# Patient Record
Sex: Female | Born: 1965 | Race: Black or African American | Hispanic: No | Smoking: Never smoker
Health system: Southern US, Community
[De-identification: ages and names within clinical notes are randomized; demographics above are authoritative.]

---

## 2005-04-22 ENCOUNTER — Other Ambulatory Visit: Admission: RE | Admit: 2005-04-22 | Discharge: 2005-04-22 | Payer: Self-pay | Admitting: Obstetrics and Gynecology

## 2005-05-18 ENCOUNTER — Encounter: Admission: RE | Admit: 2005-05-18 | Discharge: 2005-05-18 | Payer: Self-pay | Admitting: Family Medicine

## 2006-05-25 ENCOUNTER — Encounter: Admission: RE | Admit: 2006-05-25 | Discharge: 2006-05-25 | Payer: Self-pay | Admitting: Family Medicine

## 2006-05-31 ENCOUNTER — Encounter: Admission: RE | Admit: 2006-05-31 | Discharge: 2006-05-31 | Payer: Self-pay | Admitting: Family Medicine

## 2007-06-12 ENCOUNTER — Encounter: Admission: RE | Admit: 2007-06-12 | Discharge: 2007-06-12 | Payer: Self-pay | Admitting: Family Medicine

## 2007-06-26 ENCOUNTER — Other Ambulatory Visit: Admission: RE | Admit: 2007-06-26 | Discharge: 2007-06-26 | Payer: Self-pay | Admitting: Obstetrics and Gynecology

## 2008-06-14 ENCOUNTER — Encounter: Admission: RE | Admit: 2008-06-14 | Discharge: 2008-06-14 | Payer: Self-pay | Admitting: Emergency Medicine

## 2009-06-18 ENCOUNTER — Encounter: Admission: RE | Admit: 2009-06-18 | Discharge: 2009-06-18 | Payer: Self-pay | Admitting: Family Medicine

## 2010-03-01 ENCOUNTER — Encounter: Payer: Self-pay | Admitting: Family Medicine

## 2010-05-18 ENCOUNTER — Other Ambulatory Visit: Payer: Self-pay | Admitting: Family Medicine

## 2010-05-18 DIAGNOSIS — Z1231 Encounter for screening mammogram for malignant neoplasm of breast: Secondary | ICD-10-CM

## 2010-06-22 ENCOUNTER — Ambulatory Visit
Admission: RE | Admit: 2010-06-22 | Discharge: 2010-06-22 | Disposition: A | Discharge status: Home / Self Care | Payer: BC Managed Care – PPO | Source: Ambulatory Visit | Attending: Family Medicine | Admitting: Family Medicine

## 2010-06-22 DIAGNOSIS — Z1231 Encounter for screening mammogram for malignant neoplasm of breast: Secondary | ICD-10-CM

## 2011-05-18 ENCOUNTER — Other Ambulatory Visit: Payer: Self-pay | Admitting: Family Medicine

## 2011-05-18 DIAGNOSIS — Z1231 Encounter for screening mammogram for malignant neoplasm of breast: Secondary | ICD-10-CM

## 2011-06-24 ENCOUNTER — Ambulatory Visit
Admission: RE | Admit: 2011-06-24 | Discharge: 2011-06-24 | Disposition: A | Discharge status: Home / Self Care | Payer: BC Managed Care – PPO | Source: Ambulatory Visit | Attending: Family Medicine | Admitting: Family Medicine

## 2011-06-24 DIAGNOSIS — Z1231 Encounter for screening mammogram for malignant neoplasm of breast: Secondary | ICD-10-CM

## 2011-12-17 ENCOUNTER — Encounter: Payer: Self-pay | Admitting: Obstetrics and Gynecology

## 2011-12-17 ENCOUNTER — Ambulatory Visit (INDEPENDENT_AMBULATORY_CARE_PROVIDER_SITE_OTHER): Payer: BC Managed Care – PPO | Admitting: Obstetrics and Gynecology

## 2011-12-17 ENCOUNTER — Ambulatory Visit: Payer: Self-pay | Admitting: Obstetrics and Gynecology

## 2011-12-17 VITALS — BP 146/80 | Resp 18 | Ht 64.0 in | Wt 150.0 lb

## 2011-12-17 DIAGNOSIS — Z113 Encounter for screening for infections with a predominantly sexual mode of transmission: Secondary | ICD-10-CM

## 2011-12-17 DIAGNOSIS — Z01419 Encounter for gynecological examination (general) (routine) without abnormal findings: Secondary | ICD-10-CM

## 2011-12-17 DIAGNOSIS — Z124 Encounter for screening for malignant neoplasm of cervix: Secondary | ICD-10-CM

## 2011-12-17 DIAGNOSIS — N762 Acute vulvitis: Secondary | ICD-10-CM

## 2011-12-17 DIAGNOSIS — N899 Noninflammatory disorder of vagina, unspecified: Secondary | ICD-10-CM

## 2011-12-17 DIAGNOSIS — N898 Other specified noninflammatory disorders of vagina: Secondary | ICD-10-CM

## 2011-12-17 DIAGNOSIS — N760 Acute vaginitis: Secondary | ICD-10-CM

## 2011-12-17 DIAGNOSIS — Z139 Encounter for screening, unspecified: Secondary | ICD-10-CM

## 2011-12-17 MED ORDER — FLUCONAZOLE 150 MG PO TABS: 150.0000 mg | ORAL_TABLET | Freq: Once | ORAL | Status: DC

## 2011-12-17 NOTE — Progress Notes (Signed)
Patient ID: Jennifer Burke, female   DOB: 06-08-1965, 46 y.o.   MRN: [id number] Contraception abstinence/ none Last pap 11/2010 wnl Last Mammo a couple of weeks ago per pt. Last Colonoscopy never Last Dexa Scan never Primary MD Dr. Mila Palmer Abuse at Home none  C/o persistent ext vaginal itch.  No lesions and lotrisone did not help that I gave her last time.  Filed Vitals:   12/17/11 1454  BP: 146/80  Resp: 18   ROS: noncontributory  Physical Examination: General appearance - alert, well appearing, and in no distress Neck - supple, no significant adenopathy Chest - clear to auscultation, no wheezes, rales or rhonchi, symmetric air entry Heart - normal rate and regular rhythm Abdomen - soft, nontender, nondistended, no masses or organomegaly Breasts - breasts appear normal, no suspicious masses, no skin or nipple changes or axillary nodes Pelvic - normal external genitalia, vulva, vagina, cervix, uterus and adnexa Back exam - no CVAT Extremities - no edema, redness or tenderness in the calves or thighs  A/P rec see PCP about BP Wet prep - neg but secondary to persistent sxs may try diflucan - if persists and 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

## 2011-12-18 LAB — VITAMIN D 25 HYDROXY (VIT D DEFICIENCY, FRACTURES): Vit D, 25-Hydroxy: 21 ng/mL — ABNORMAL LOW (ref 30–89)

## 2011-12-18 LAB — CBC
HCT: 36 % (ref 36.0–46.0)
MCH: 29.2 pg (ref 26.0–34.0)
MCHC: 35 g/dL (ref 30.0–36.0)
RDW: 13.2 % (ref 11.5–15.5)
WBC: 7.3 10*3/uL (ref 4.0–10.5)

## 2011-12-20 LAB — PAP IG W/ RFLX HPV ASCU

## 2011-12-23 ENCOUNTER — Telehealth: Payer: Self-pay

## 2011-12-23 NOTE — Telephone Encounter (Signed)
Left message for pt to return call. Pt need to be advised of lab results. Mathis Bud

## 2011-12-24 ENCOUNTER — Telehealth: Payer: Self-pay

## 2011-12-24 DIAGNOSIS — E559 Vitamin D deficiency, unspecified: Secondary | ICD-10-CM

## 2011-12-24 NOTE — Telephone Encounter (Signed)
Pt returned call regarding lab results. Pt was advised +HSV1 and HSV 2. Pt was also given Vit-d protocol. RX was called in to [facility] for Vit-d softgels 50,000 units 1 capsule 1 x wk for 12 wks # 20 0RF. Pt voice understanding. Future lab ordered.  Mathis Bud

## 2011-12-30 ENCOUNTER — Other Ambulatory Visit (HOSPITAL_COMMUNITY): Payer: Self-pay | Admitting: Family Medicine

## 2011-12-30 ENCOUNTER — Other Ambulatory Visit: Payer: Self-pay | Admitting: Family Medicine

## 2011-12-30 ENCOUNTER — Ambulatory Visit (HOSPITAL_COMMUNITY)
Admission: RE | Admit: 2011-12-30 | Discharge: 2011-12-30 | Disposition: A | Discharge status: Home / Self Care | Payer: BC Managed Care – PPO | Source: Ambulatory Visit | Attending: Family Medicine | Admitting: Family Medicine

## 2011-12-30 DIAGNOSIS — R7989 Other specified abnormal findings of blood chemistry: Secondary | ICD-10-CM

## 2011-12-30 DIAGNOSIS — Z9089 Acquired absence of other organs: Secondary | ICD-10-CM | POA: Insufficient documentation

## 2011-12-30 DIAGNOSIS — K7689 Other specified diseases of liver: Secondary | ICD-10-CM | POA: Insufficient documentation

## 2011-12-31 ENCOUNTER — Other Ambulatory Visit: Payer: BC Managed Care – PPO

## 2012-01-24 ENCOUNTER — Ambulatory Visit: Payer: Self-pay | Admitting: Obstetrics and Gynecology

## 2012-05-23 ENCOUNTER — Other Ambulatory Visit: Payer: Self-pay

## 2012-05-23 DIAGNOSIS — Z1231 Encounter for screening mammogram for malignant neoplasm of breast: Secondary | ICD-10-CM

## 2012-06-26 ENCOUNTER — Ambulatory Visit
Admission: RE | Admit: 2012-06-26 | Discharge: 2012-06-26 | Disposition: A | Discharge status: Home / Self Care | Payer: BC Managed Care – PPO | Source: Ambulatory Visit

## 2012-06-26 DIAGNOSIS — Z1231 Encounter for screening mammogram for malignant neoplasm of breast: Secondary | ICD-10-CM

## 2013-05-21 ENCOUNTER — Other Ambulatory Visit: Payer: Self-pay | Admitting: Family Medicine

## 2013-05-21 ENCOUNTER — Ambulatory Visit
Admission: RE | Admit: 2013-05-21 | Discharge: 2013-05-21 | Disposition: A | Discharge status: Home / Self Care | Payer: BC Managed Care – PPO | Source: Ambulatory Visit | Attending: Family Medicine | Admitting: Family Medicine

## 2013-05-21 DIAGNOSIS — M20019 Mallet finger of unspecified finger(s): Secondary | ICD-10-CM

## 2013-05-24 ENCOUNTER — Other Ambulatory Visit: Payer: Self-pay

## 2013-05-24 DIAGNOSIS — Z1231 Encounter for screening mammogram for malignant neoplasm of breast: Secondary | ICD-10-CM

## 2013-06-29 ENCOUNTER — Ambulatory Visit
Admission: RE | Admit: 2013-06-29 | Discharge: 2013-06-29 | Disposition: A | Discharge status: Home / Self Care | Payer: BC Managed Care – PPO | Source: Ambulatory Visit

## 2013-06-29 DIAGNOSIS — Z1231 Encounter for screening mammogram for malignant neoplasm of breast: Secondary | ICD-10-CM

## 2014-06-06 ENCOUNTER — Other Ambulatory Visit: Payer: Self-pay

## 2014-06-06 DIAGNOSIS — Z1231 Encounter for screening mammogram for malignant neoplasm of breast: Secondary | ICD-10-CM

## 2014-07-09 ENCOUNTER — Ambulatory Visit
Admission: RE | Admit: 2014-07-09 | Discharge: 2014-07-09 | Disposition: A | Discharge status: Home / Self Care | Payer: BC Managed Care – PPO | Source: Ambulatory Visit

## 2014-07-09 DIAGNOSIS — Z1231 Encounter for screening mammogram for malignant neoplasm of breast: Secondary | ICD-10-CM

## 2015-06-03 ENCOUNTER — Other Ambulatory Visit: Payer: Self-pay

## 2015-06-03 DIAGNOSIS — Z1231 Encounter for screening mammogram for malignant neoplasm of breast: Secondary | ICD-10-CM

## 2015-07-22 ENCOUNTER — Other Ambulatory Visit: Payer: Self-pay | Admitting: Family Medicine

## 2015-07-22 ENCOUNTER — Ambulatory Visit
Admission: RE | Admit: 2015-07-22 | Discharge: 2015-07-22 | Disposition: A | Discharge status: Home / Self Care | Payer: BC Managed Care – PPO | Source: Ambulatory Visit

## 2015-07-22 DIAGNOSIS — Z1231 Encounter for screening mammogram for malignant neoplasm of breast: Secondary | ICD-10-CM

## 2016-06-14 ENCOUNTER — Other Ambulatory Visit: Payer: Self-pay | Admitting: Family Medicine

## 2016-06-14 DIAGNOSIS — Z1231 Encounter for screening mammogram for malignant neoplasm of breast: Secondary | ICD-10-CM

## 2016-07-26 ENCOUNTER — Ambulatory Visit
Admission: RE | Admit: 2016-07-26 | Discharge: 2016-07-26 | Disposition: A | Discharge status: Home / Self Care | Payer: BC Managed Care – PPO | Source: Ambulatory Visit | Attending: Family Medicine | Admitting: Family Medicine

## 2016-07-26 DIAGNOSIS — Z1231 Encounter for screening mammogram for malignant neoplasm of breast: Secondary | ICD-10-CM

## 2017-06-20 ENCOUNTER — Other Ambulatory Visit: Payer: Self-pay | Admitting: Family Medicine

## 2017-06-20 DIAGNOSIS — Z1231 Encounter for screening mammogram for malignant neoplasm of breast: Secondary | ICD-10-CM

## 2017-07-28 ENCOUNTER — Ambulatory Visit: Payer: BC Managed Care – PPO

## 2017-08-22 ENCOUNTER — Ambulatory Visit
Admission: RE | Admit: 2017-08-22 | Discharge: 2017-08-22 | Disposition: A | Discharge status: Home / Self Care | Payer: BC Managed Care – PPO | Source: Ambulatory Visit | Attending: Family Medicine | Admitting: Family Medicine

## 2017-08-22 DIAGNOSIS — Z1231 Encounter for screening mammogram for malignant neoplasm of breast: Secondary | ICD-10-CM

## 2018-07-20 ENCOUNTER — Other Ambulatory Visit: Payer: Self-pay | Admitting: Family Medicine

## 2018-07-20 DIAGNOSIS — Z1231 Encounter for screening mammogram for malignant neoplasm of breast: Secondary | ICD-10-CM

## 2018-09-06 ENCOUNTER — Ambulatory Visit
Admission: RE | Admit: 2018-09-06 | Discharge: 2018-09-06 | Disposition: A | Discharge status: Home / Self Care | Payer: BC Managed Care – PPO | Source: Ambulatory Visit | Attending: Family Medicine | Admitting: Family Medicine

## 2018-09-06 ENCOUNTER — Other Ambulatory Visit: Payer: Self-pay

## 2018-09-06 DIAGNOSIS — Z1231 Encounter for screening mammogram for malignant neoplasm of breast: Secondary | ICD-10-CM

## 2019-07-30 ENCOUNTER — Other Ambulatory Visit: Payer: Self-pay | Admitting: Family Medicine

## 2019-07-30 DIAGNOSIS — Z1231 Encounter for screening mammogram for malignant neoplasm of breast: Secondary | ICD-10-CM

## 2019-09-07 ENCOUNTER — Ambulatory Visit
Admission: RE | Admit: 2019-09-07 | Discharge: 2019-09-07 | Disposition: A | Discharge status: Home / Self Care | Payer: BC Managed Care – PPO | Source: Ambulatory Visit | Attending: Family Medicine | Admitting: Family Medicine

## 2019-09-07 ENCOUNTER — Other Ambulatory Visit: Payer: Self-pay

## 2019-09-07 DIAGNOSIS — Z1231 Encounter for screening mammogram for malignant neoplasm of breast: Secondary | ICD-10-CM

## 2020-07-16 ENCOUNTER — Other Ambulatory Visit: Payer: Self-pay | Admitting: Family Medicine

## 2020-07-16 DIAGNOSIS — Z1231 Encounter for screening mammogram for malignant neoplasm of breast: Secondary | ICD-10-CM

## 2020-09-15 ENCOUNTER — Other Ambulatory Visit: Payer: Self-pay

## 2020-09-15 ENCOUNTER — Ambulatory Visit
Admission: RE | Admit: 2020-09-15 | Discharge: 2020-09-15 | Disposition: A | Discharge status: Home / Self Care | Payer: BC Managed Care – PPO | Source: Ambulatory Visit | Attending: Family Medicine | Admitting: Family Medicine

## 2020-09-15 DIAGNOSIS — Z1231 Encounter for screening mammogram for malignant neoplasm of breast: Secondary | ICD-10-CM

## 2021-08-15 IMAGING — MG DIGITAL SCREENING BILAT W/ TOMO W/ CAD
8 series · 8 of 24 positions shown · non-contrast
Comparison: Previous exam(s).

CLINICAL DATA: Screening.

EXAM:
DIGITAL SCREENING BILATERAL MAMMOGRAM WITH TOMO AND CAD

[L CC synth-2D]
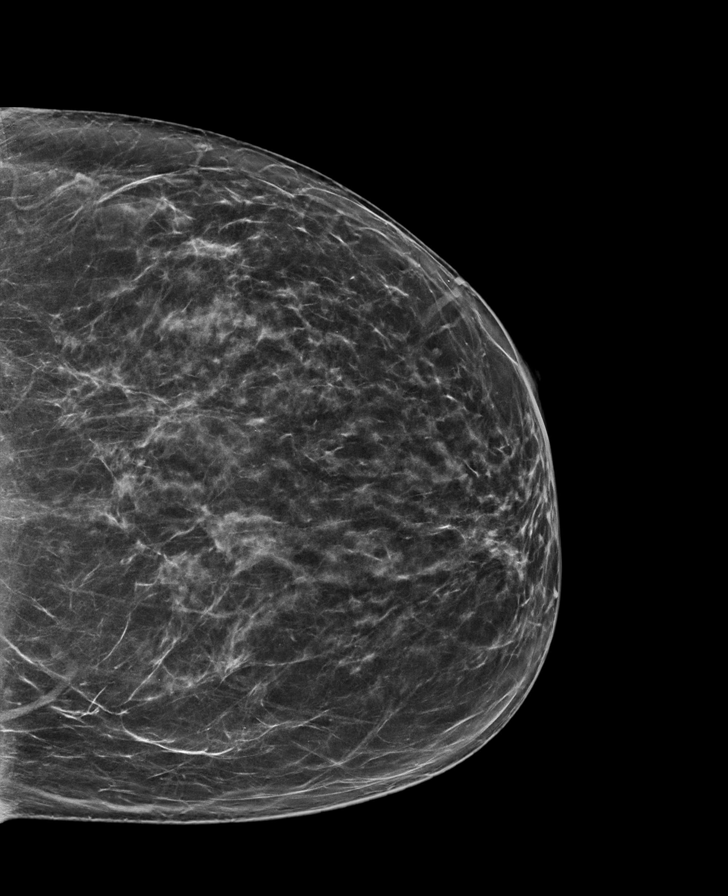

[R MLO synth-2D]
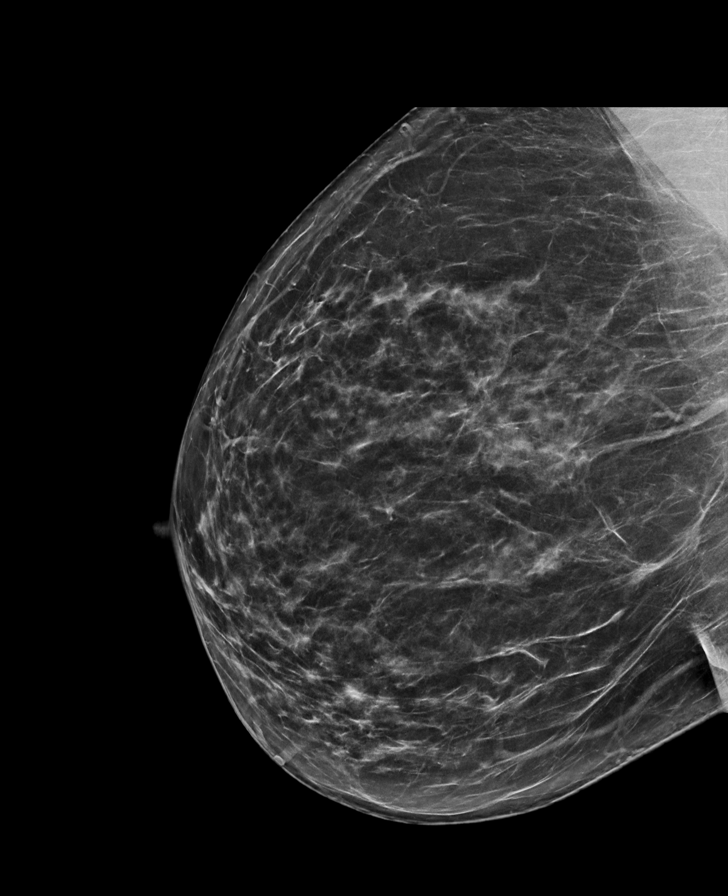

[R CC synth-2D]
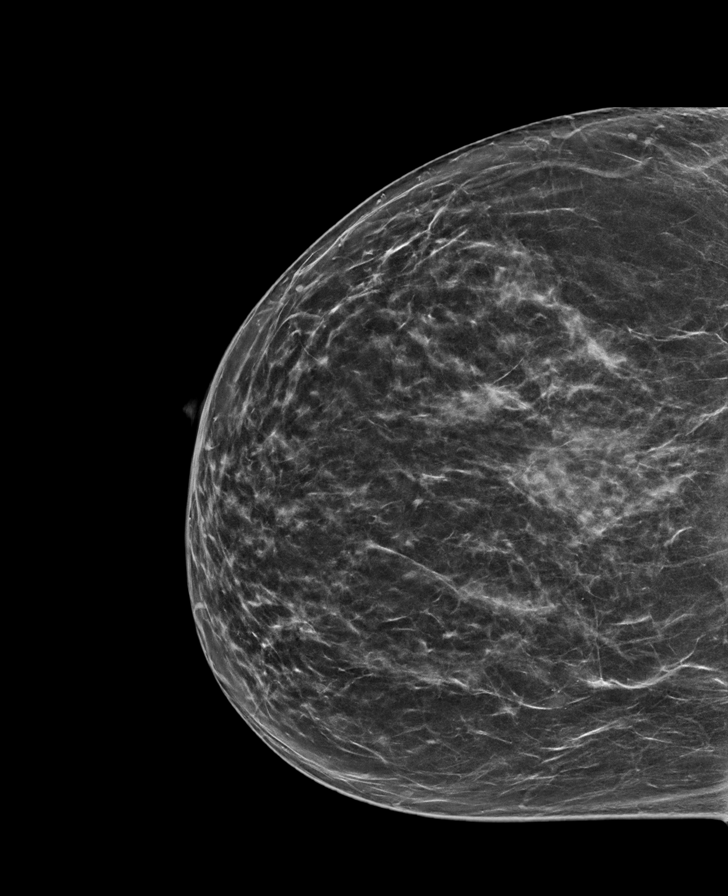

[L MLO synth-2D]
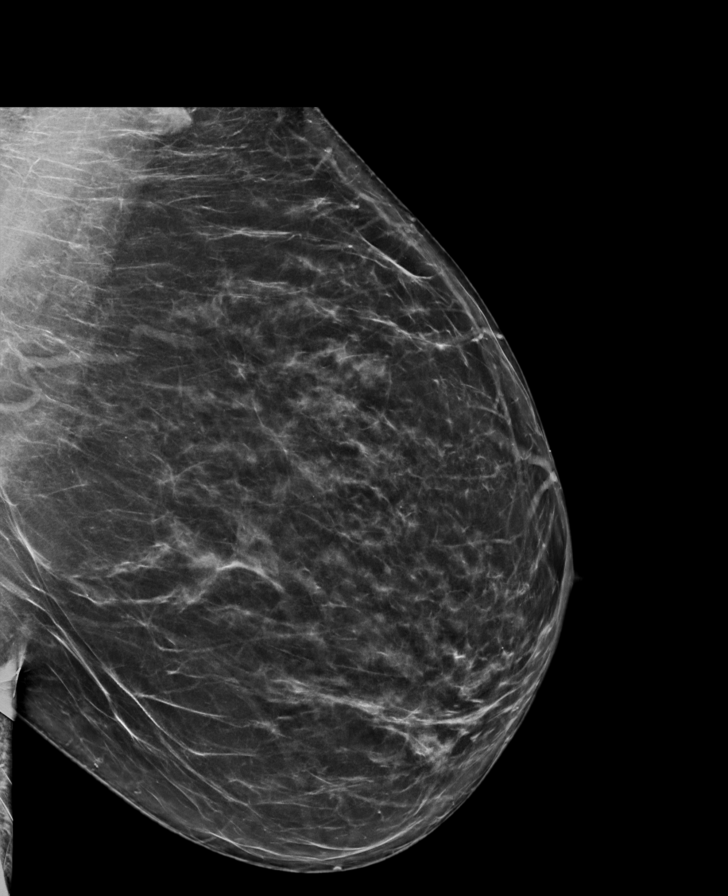

[L CC tomo · tomo slice 37/73.0]
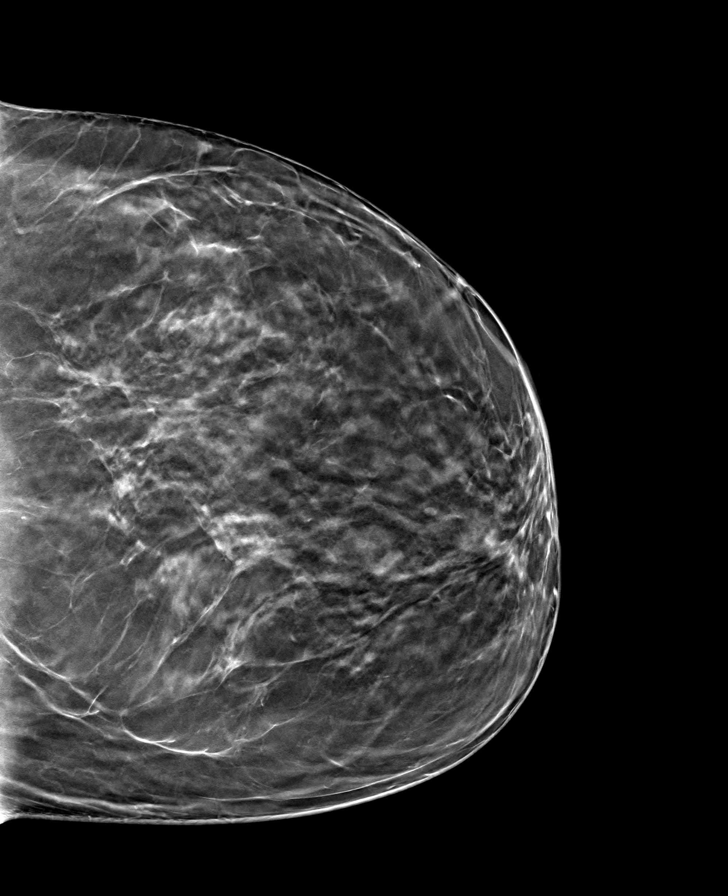

[R MLO tomo · tomo slice 40/79.0]
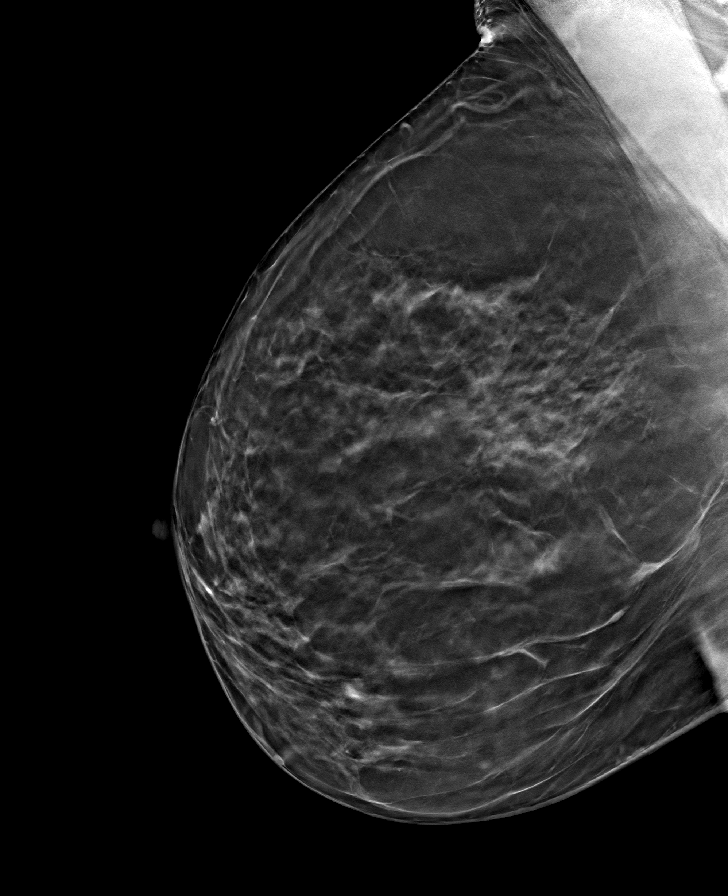

[L MLO tomo · tomo slice 40/79.0]
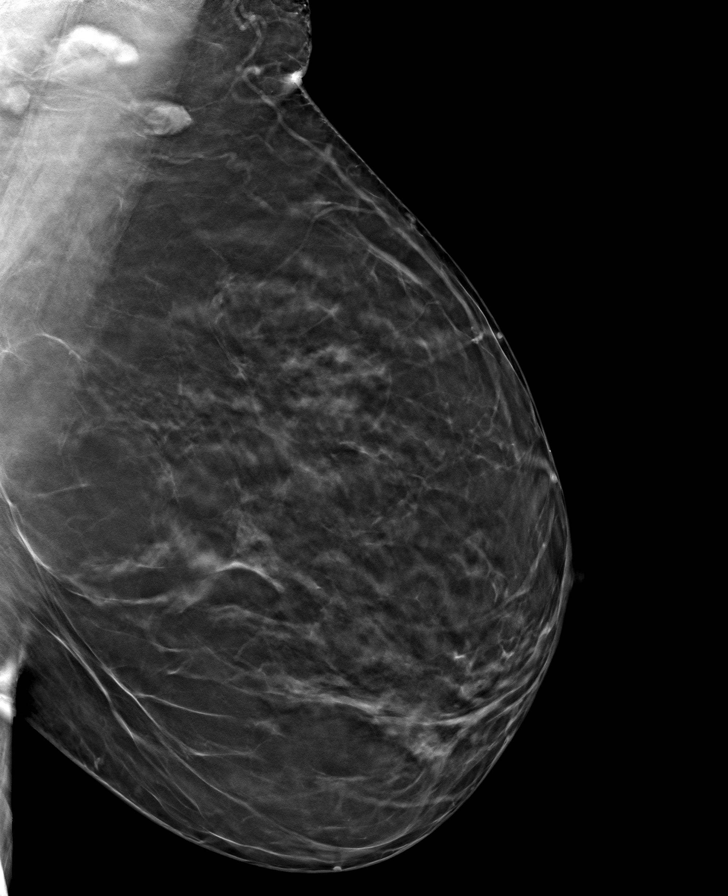

[R CC tomo · tomo slice 37/73.0]
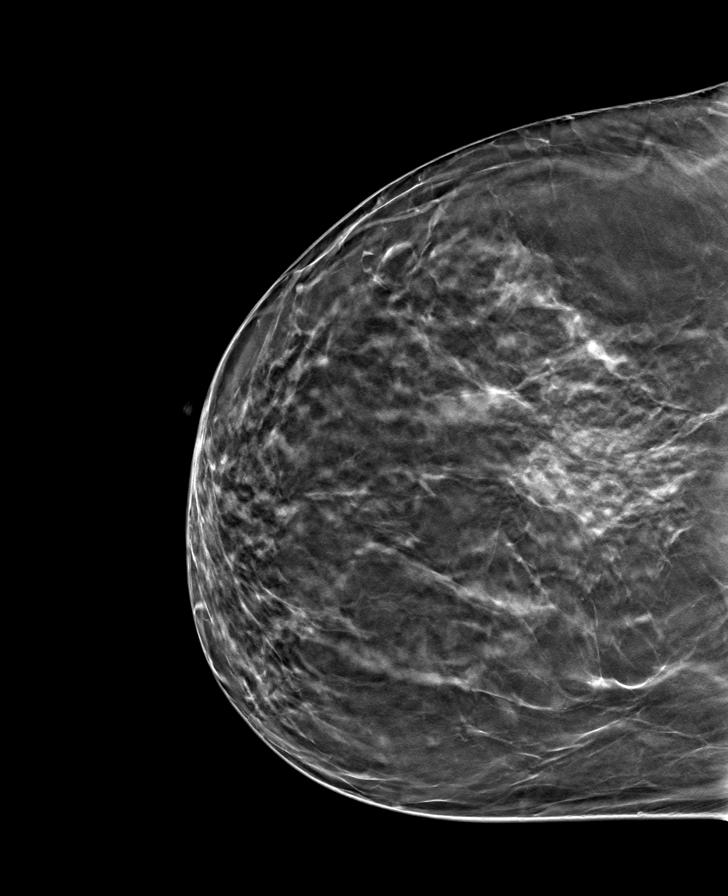

[8 of 24 positions shown; findings below may reference images not displayed]

ACR Breast Density Category b: There are scattered areas of
fibroglandular density.
FINDINGS: There are no findings suspicious for malignancy. Images were
processed with CAD.
IMPRESSION: No mammographic evidence of malignancy. A result letter of this
screening mammogram will be mailed directly to the patient.

RECOMMENDATION:
Screening mammogram in one year. (Code:[id number])

BI-RADS CATEGORY  1: Negative.

## 2021-08-17 ENCOUNTER — Other Ambulatory Visit: Payer: Self-pay | Admitting: Family Medicine

## 2021-08-17 DIAGNOSIS — Z1231 Encounter for screening mammogram for malignant neoplasm of breast: Secondary | ICD-10-CM

## 2021-09-03 ENCOUNTER — Other Ambulatory Visit: Payer: Self-pay | Admitting: Family Medicine

## 2021-09-03 DIAGNOSIS — E7800 Pure hypercholesterolemia, unspecified: Secondary | ICD-10-CM

## 2021-09-11 ENCOUNTER — Ambulatory Visit
Admission: RE | Admit: 2021-09-11 | Discharge: 2021-09-11 | Disposition: A | Discharge status: Home / Self Care | Payer: BC Managed Care – PPO | Source: Ambulatory Visit | Attending: Family Medicine | Admitting: Family Medicine

## 2021-09-11 DIAGNOSIS — E7800 Pure hypercholesterolemia, unspecified: Secondary | ICD-10-CM

## 2021-09-16 ENCOUNTER — Ambulatory Visit
Admission: RE | Admit: 2021-09-16 | Discharge: 2021-09-16 | Disposition: A | Discharge status: Home / Self Care | Payer: BC Managed Care – PPO | Source: Ambulatory Visit | Attending: Family Medicine | Admitting: Family Medicine

## 2021-09-16 DIAGNOSIS — Z1231 Encounter for screening mammogram for malignant neoplasm of breast: Secondary | ICD-10-CM

## 2021-09-18 ENCOUNTER — Other Ambulatory Visit: Payer: Self-pay | Admitting: Family Medicine

## 2021-09-18 DIAGNOSIS — R928 Other abnormal and inconclusive findings on diagnostic imaging of breast: Secondary | ICD-10-CM

## 2021-09-28 ENCOUNTER — Ambulatory Visit
Admission: RE | Admit: 2021-09-28 | Discharge: 2021-09-28 | Disposition: A | Discharge status: Home / Self Care | Payer: BC Managed Care – PPO | Source: Ambulatory Visit | Attending: Family Medicine | Admitting: Family Medicine

## 2021-09-28 ENCOUNTER — Other Ambulatory Visit: Payer: Self-pay | Admitting: Family Medicine

## 2021-09-28 DIAGNOSIS — R928 Other abnormal and inconclusive findings on diagnostic imaging of breast: Secondary | ICD-10-CM

## 2021-09-28 DIAGNOSIS — N6310 Unspecified lump in the right breast, unspecified quadrant: Secondary | ICD-10-CM

## 2021-12-30 ENCOUNTER — Ambulatory Visit (HOSPITAL_BASED_OUTPATIENT_CLINIC_OR_DEPARTMENT_OTHER): Payer: BC Managed Care – PPO | Admitting: Cardiovascular Disease

## 2021-12-30 ENCOUNTER — Encounter (HOSPITAL_BASED_OUTPATIENT_CLINIC_OR_DEPARTMENT_OTHER): Payer: Self-pay | Admitting: Cardiovascular Disease

## 2021-12-30 VITALS — BP 126/78 | HR 85 | Ht 62.0 in | Wt 163.1 lb

## 2021-12-30 DIAGNOSIS — Z5181 Encounter for therapeutic drug level monitoring: Secondary | ICD-10-CM

## 2021-12-30 DIAGNOSIS — I10 Essential (primary) hypertension: Secondary | ICD-10-CM

## 2021-12-30 DIAGNOSIS — G4733 Obstructive sleep apnea (adult) (pediatric): Secondary | ICD-10-CM

## 2021-12-30 DIAGNOSIS — E7800 Pure hypercholesterolemia, unspecified: Secondary | ICD-10-CM

## 2021-12-30 DIAGNOSIS — I2510 Atherosclerotic heart disease of native coronary artery without angina pectoris: Secondary | ICD-10-CM

## 2021-12-30 HISTORY — DX: Essential (primary) hypertension: I10

## 2021-12-30 HISTORY — DX: Atherosclerotic heart disease of native coronary artery without angina pectoris: I25.10

## 2021-12-30 HISTORY — DX: Obstructive sleep apnea (adult) (pediatric): G47.33

## 2021-12-30 HISTORY — DX: Pure hypercholesterolemia, unspecified: E78.00

## 2021-12-30 NOTE — Assessment & Plan Note (Signed)
Blood pressure well-controlled since adding HCTZ.  Continue amlodipine and HCTZ.  We discussed increasing exercise to least 150 minutes weekly.  Work on limiting sodium intake.

## 2021-12-30 NOTE — Assessment & Plan Note (Signed)
Coronary calcium score was 24.5, which is 82nd percentile.  She was appropriately started on statin therapy.  LDL goal is less than 70.  She will come back for fasting lipids and a CMP.

## 2021-12-30 NOTE — Assessment & Plan Note (Signed)
She is using her CPAP faithfully but not seeing much benefit.  She had no symptoms from OSA other than snoring.

## 2021-12-30 NOTE — Patient Instructions (Signed)
Medication Instructions:  Your physician recommends that you continue on your current medications as directed. Please refer to the Current Medication list given to you today.   Labwork: FASTING LP/CMET SOON   Testing/Procedures: NONE  Follow-Up: AS NEEDED    

## 2021-12-30 NOTE — Progress Notes (Signed)
Advanced Hypertension Clinic Initial Assessment:    Date:  12/30/2021   ID:  Jennifer Burke, DOB 07/21/1965, MRN [id number]  PCP:  Mila Palmer, MD  Cardiologist:  None  Nephrologist:  Referring MD: Maxie Better, MD   CC: Hypertension  History of Present Illness:    Jennifer Burke is a 56 y.o. female with a hx of HTN here to establish care in the [facility]pertension Clinic. Referral note by Dr. Cherly Hensen personally reviewed. At her visit, her BP was 150/91 on 5 mg amlodipine. She was referred to the advanced hypertension clinic for further evaluation.  Today, she states she is feeling good. At home, she has not had any issues with her BP. She notes that she can tell if her BP is high. She is compliant with amlodipine in the evening. Her PCP started her on HCTZ, which she also takes in the morning. She has a morning routine of a teaspoon of vinegar, cranberry juice, and lemon juice for her immune system. She does not believe this affects her blood pressure. Generally she denies any major issues with LE edema. She does not exercise regularly, but when she does she feels okay without anginal symptoms. In 08/2021 her LDL was 161. On 09/11/21 she had a CT revealing a coronary calcium score of 24.5. She had been started on 5 MG rosuvastatin which she is tolerating. She notes that she just started CPAP due to intervals of breathing cessation during the night. She uses it for 8 hours a night, and she says her daughter confirms resolution of her snoring. However, using the CPAP is uncomfortable and she does not feel any differently when waking up in the morning. She denies any palpitations, chest pain. No lightheadedness, headaches, syncope, orthopnea, or PND.   Previous antihypertensives: N/a  Past Medical History:  Diagnosis Date   CAD in native artery 12/30/2021   Essential hypertension 12/30/2021   OSA (obstructive sleep apnea) 12/30/2021   Pure hypercholesterolemia 12/30/2021     History reviewed. No pertinent surgical history.  Current Medications: Current Meds  Medication Sig   amLODipine (NORVASC) 5 MG tablet Take 5 mg by mouth daily.   hydrochlorothiazide (HYDRODIURIL) 12.5 MG tablet Take 12.5 mg by mouth daily.   rosuvastatin (CRESTOR) 5 MG tablet Take 5 mg by mouth daily.     Allergies:   Patient has no known allergies.   Social History   Socioeconomic History   Marital status: Divorced    Spouse name: Not on file   Number of children: Not on file   Years of education: Not on file   Highest education level: Not on file  Occupational History   Not on file  Tobacco Use   Smoking status: Never   Smokeless tobacco: Not on file  Substance and Sexual Activity   Alcohol use: Not on file   Drug use: Not on file   Sexual activity: Not on file  Other Topics Concern   Not on file  Social History Narrative   Not on file   Social Determinants of Health   Financial Resource Strain: Low Risk  (12/30/2021)   Overall Financial Resource Strain (CARDIA)    Difficulty of Paying Living Expenses: Not hard at all  Food Insecurity: No Food Insecurity (12/30/2021)   Hunger Vital Sign    Worried About Running Out of Food in the Last Year: Never true    Ran Out of Food in the Last Year: Never true  Transportation Needs: No Transportation  Needs (12/30/2021)   PRAPARE - Administrator, Civil Service (Medical): No    Lack of Transportation (Non-Medical): No  Physical Activity: Inactive (12/30/2021)   Exercise Vital Sign    Days of Exercise per Week: 0 days    Minutes of Exercise per Session: 0 min  Stress: Not on file  Social Connections: Not on file     Family History: The patient's family history includes Hypertension in her father, mother, and paternal grandmother.  ROS:   Please see the history of present illness.    All other systems reviewed and are negative.  EKGs/Labs/Other Studies Reviewed:    CT calcium score  09/11/2021: IMPRESSION: 1. Total calcium score of 24.5 is at percentile 82 for subjects of the same age, gender, and race/ethnicity. 2. Mild aortic Atherosclerosis (ICD10-I70.0). 3. Small solid pulmonary nodule of the right middle lobe measuring 3 mm. No follow-up needed if patient is low-risk.This recommendation follows the consensus statement: Guidelines for Management of Incidental Pulmonary Nodules Detected on CT Images: From the Fleischner Society 2017; Radiology 2017; 284:228-243.   EKG:  EKG has been personally reviewed. 12/30/2021: NSR. Low voltage. Rate 85 bpm.     Recent Labs: No results found for requested labs within last 365 days.   Recent Lipid Panel No results found for: "CHOL", "TRIG", "HDL", "CHOLHDL", "VLDL", "LDLCALC", "LDLDIRECT"  Physical Exam:   VS:  BP 126/78 (BP Location: Right Arm, Patient Position: Sitting, Cuff Size: Normal)   Pulse 85   Ht 5\' 2"  (1.575 m)   Wt 163 lb 1.6 oz (74 kg)   LMP 11/20/2011   BMI 29.83 kg/m  , BMI Body mass index is 29.83 kg/m. GENERAL:  Well appearing HEENT: Pupils equal round and reactive, fundi not visualized, oral mucosa unremarkable NECK:  No jugular venous distention, waveform within normal limits, carotid upstroke brisk and symmetric, no bruits, no thyromegaly LUNGS:  Clear to auscultation bilaterally HEART:  RRR.  PMI not displaced or sustained,S1 and S2 within normal limits, no S3, no S4, no clicks, no rubs, no murmurs ABD:  Flat, positive bowel sounds normal in frequency in pitch, no bruits, no rebound, no guarding, no midline pulsatile mass, no hepatomegaly, no splenomegaly EXT:  2 plus pulses throughout, no edema, no cyanosis no clubbing SKIN:  No rashes no nodules NEURO:  Cranial nerves II through XII grossly intact, motor grossly intact throughout PSYCH:  Cognitively intact, oriented to person place and time   ASSESSMENT/PLAN:    Essential hypertension Blood pressure well-controlled since adding HCTZ.   Continue amlodipine and HCTZ.  We discussed increasing exercise to least 150 minutes weekly.  Work on limiting sodium intake.  CAD in native artery Coronary calcium score was 24.5, which is 82nd percentile.  She was appropriately started on statin therapy.  LDL goal is less than 70.  She will come back for fasting lipids and a CMP.  Pure hypercholesterolemia Continue rosuvastatin as above.  Keep working on diet and exercise.  OSA (obstructive sleep apnea) She is using her CPAP faithfully but not seeing much benefit.  She had no symptoms from OSA other than snoring.   Screening for Secondary Hypertension:     Relevant Labs/Studies:       Latest Ref Rng & Units 12/17/2011    3:49 PM  Thyroid   TSH 0.350 - 4.500 uIU/mL 0.525      Disposition:    FU with Josefine Fuhr C. 13/09/2011, MD, [facility] as needed.  Medication Adjustments/Labs and Tests Ordered:  Current medicines are reviewed at length with the patient today.  Concerns regarding medicines are outlined above.   Orders Placed This Encounter  Procedures   Lipid panel   Comprehensive metabolic panel   EKG 12-Lead   No orders of the defined types were placed in this encounter.  I,Mitra Faeizi,acting as a Neurosurgeon for [employer], MD.,have documented all relevant documentation on the behalf of Chilton Si, MD,as directed by  Chilton Si, MD while in the presence of Chilton Si, MD.   I, Laetitia Schnepf C. Duke Salvia, MD have reviewed all documentation for this visit.  The documentation of the exam, diagnosis, procedures, and orders on 12/30/2021 are all accurate and complete.   Signed, Chilton Si, MD  12/30/2021 11:21 AM    [facility]

## 2021-12-30 NOTE — Assessment & Plan Note (Signed)
Continue rosuvastatin as above.  Keep working on diet and exercise.

## 2022-01-12 LAB — COMPREHENSIVE METABOLIC PANEL
ALT: 24 IU/L (ref 0–32)
AST: 24 IU/L (ref 0–40)
Albumin/Globulin Ratio: 1.4 (ref 1.2–2.2)
Albumin: 4.6 g/dL (ref 3.8–4.9)
Alkaline Phosphatase: 68 IU/L (ref 44–121)
BUN/Creatinine Ratio: 11 (ref 9–23)
BUN: 12 mg/dL (ref 6–24)
Bilirubin Total: 0.3 mg/dL (ref 0.0–1.2)
CO2: 23 mmol/L (ref 20–29)
Calcium: 10.3 mg/dL — ABNORMAL HIGH (ref 8.7–10.2)
Chloride: 102 mmol/L (ref 96–106)
Creatinine, Ser: 1.07 mg/dL — ABNORMAL HIGH (ref 0.57–1.00)
Globulin, Total: 3.4 g/dL (ref 1.5–4.5)
Glucose: 101 mg/dL — ABNORMAL HIGH (ref 70–99)
Potassium: 4.4 mmol/L (ref 3.5–5.2)
Sodium: 141 mmol/L (ref 134–144)
Total Protein: 8 g/dL (ref 6.0–8.5)
eGFR: 60 mL/min/{1.73_m2} (ref >59)

## 2022-01-12 LAB — LIPID PANEL
Chol/HDL Ratio: 2.8 ratio (ref 0.0–4.4)
Cholesterol, Total: 125 mg/dL (ref 100–199)
HDL: 45 mg/dL (ref >39)
LDL Chol Calc (NIH): 68 mg/dL (ref 0–99)
Triglycerides: 52 mg/dL (ref 0–149)
VLDL Cholesterol Cal: 12 mg/dL (ref 5–40)

## 2022-04-01 ENCOUNTER — Other Ambulatory Visit: Payer: BC Managed Care – PPO

## 2022-05-07 ENCOUNTER — Ambulatory Visit: Payer: BC Managed Care – PPO

## 2022-05-07 ENCOUNTER — Ambulatory Visit
Admission: RE | Admit: 2022-05-07 | Discharge: 2022-05-07 | Disposition: A | Discharge status: Home / Self Care | Payer: BC Managed Care – PPO | Source: Ambulatory Visit | Attending: Family Medicine | Admitting: Family Medicine

## 2022-05-07 ENCOUNTER — Other Ambulatory Visit: Payer: Self-pay | Admitting: Family Medicine

## 2022-05-07 DIAGNOSIS — N6310 Unspecified lump in the right breast, unspecified quadrant: Secondary | ICD-10-CM

## 2022-11-18 ENCOUNTER — Ambulatory Visit
Admission: RE | Admit: 2022-11-18 | Discharge: 2022-11-18 | Disposition: A | Discharge status: Home / Self Care | Payer: BC Managed Care – PPO | Source: Ambulatory Visit | Attending: Family Medicine | Admitting: Family Medicine

## 2022-11-18 DIAGNOSIS — N6310 Unspecified lump in the right breast, unspecified quadrant: Secondary | ICD-10-CM

## 2023-08-08 ENCOUNTER — Other Ambulatory Visit (HOSPITAL_BASED_OUTPATIENT_CLINIC_OR_DEPARTMENT_OTHER): Payer: Self-pay

## 2023-08-08 MED ORDER — WEGOVY 0.25 MG/0.5ML ~~LOC~~ SOAJ
0.2500 mg | SUBCUTANEOUS | 0 refills | Status: AC
  Filled 2023-08-08: qty 2, 28d supply, fill #0

## 2023-10-06 ENCOUNTER — Other Ambulatory Visit: Payer: Self-pay | Admitting: Family Medicine

## 2023-10-06 DIAGNOSIS — Z09 Encounter for follow-up examination after completed treatment for conditions other than malignant neoplasm: Secondary | ICD-10-CM

## 2023-10-06 DIAGNOSIS — N6489 Other specified disorders of breast: Secondary | ICD-10-CM

## 2023-11-22 ENCOUNTER — Ambulatory Visit
Admission: RE | Admit: 2023-11-22 | Discharge: 2023-11-22 | Disposition: A | Discharge status: Home / Self Care | Payer: Self-pay | Source: Ambulatory Visit | Attending: Family Medicine | Admitting: Family Medicine

## 2023-11-22 DIAGNOSIS — N6489 Other specified disorders of breast: Secondary | ICD-10-CM

## 2023-11-22 DIAGNOSIS — Z09 Encounter for follow-up examination after completed treatment for conditions other than malignant neoplasm: Secondary | ICD-10-CM
